# Patient Record
Sex: Male | Born: 1970 | Race: White | Hispanic: Yes | Marital: Single | Smoking: Never smoker
Health system: Southern US, Community
[De-identification: ages and names within clinical notes are randomized; demographics above are authoritative.]

---

## 2010-06-16 ENCOUNTER — Emergency Department (HOSPITAL_COMMUNITY): Admission: EM | Admit: 2010-06-16 | Discharge: 2010-06-16 | Payer: Self-pay | Admitting: Emergency Medicine
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
## 2020-06-18 ENCOUNTER — Encounter (HOSPITAL_COMMUNITY): Payer: Self-pay

## 2020-06-18 ENCOUNTER — Ambulatory Visit (HOSPITAL_COMMUNITY)
Admission: EM | Admit: 2020-06-18 | Discharge: 2020-06-18 | Disposition: A | Discharge status: Home / Self Care | Payer: Self-pay | Attending: Emergency Medicine | Admitting: Emergency Medicine

## 2020-06-18 ENCOUNTER — Other Ambulatory Visit: Payer: Self-pay

## 2020-06-18 DIAGNOSIS — L239 Allergic contact dermatitis, unspecified cause: Secondary | ICD-10-CM

## 2020-06-18 DIAGNOSIS — R21 Rash and other nonspecific skin eruption: Secondary | ICD-10-CM

## 2020-06-18 MED ORDER — CETIRIZINE HCL 10 MG PO CAPS: 10.0000 mg | ORAL_CAPSULE | Freq: Every day | ORAL | 0 refills | Status: DC

## 2020-06-18 MED ORDER — PREDNISONE 10 MG PO TABS: ORAL_TABLET | ORAL | 0 refills | Status: DC

## 2020-06-18 NOTE — ED Provider Notes (Signed)
[facility]    CSN: [id number] Arrival date & time: 06/18/20  1654      History   Chief Complaint Chief Complaint  Patient presents with  . Rash    HPI Joe Cook is a 49 y.o. male presenting today for evaluation of a rash.  Patient began to develop a rash to his left upper arm which has extended into his trunk and lower extremities over the past 3 days.  Associated with itching.  Denies any difficulty breathing or shortness of breath.  Denies any rash on face or mouth.  Denies any new exposures.  Denies changes in hygiene products.  Denies any new foods or medicines.  Has been in the backyard for the past week, but no known exposure to poison ivy.  Took some Benadryl for symptoms.  HPI  History reviewed. No pertinent past medical history.  There are no problems to display for this patient.   History reviewed. No pertinent surgical history.     Home Medications    Prior to Admission medications   Medication Sig Start Date End Date Taking? Authorizing Provider  Cetirizine HCl 10 MG CAPS Take 1 capsule (10 mg total) by mouth daily for 10 days. 06/18/20 06/28/20  Rexanne Inocencio C, PA-C  predniSONE (DELTASONE) 10 MG tablet Begin with 6 tabs on day 1, 5 tab on day 2, 4 tab on day 3, 3 tab on day 4, 2 tab on day 5, 1 tab on day 6-take with food 06/18/20   Zakirah Weingart, Junius Creamer, PA-C    Family History History reviewed. No pertinent family history.  Social History Social History   Tobacco Use  . Smoking status: Never Smoker  . Smokeless tobacco: Never Used  Substance Use Topics  . Alcohol use: Yes  . Drug use: Never     Allergies   Patient has no known allergies.   Review of Systems Review of Systems  Constitutional: Negative for fatigue and fever.  Eyes: Negative for redness, itching and visual disturbance.  Respiratory: Negative for shortness of breath.   Cardiovascular: Negative for chest pain and leg swelling.  Gastrointestinal: Negative for  nausea and vomiting.  Musculoskeletal: Negative for arthralgias and myalgias.  Skin: Positive for color change and rash. Negative for wound.  Neurological: Negative for dizziness, syncope, weakness, light-headedness and headaches.     Physical Exam Triage Vital Signs ED Triage Vitals  Enc Vitals Group     BP 06/18/20 1810 (!) 141/93     Pulse Rate 06/18/20 1810 67     Resp 06/18/20 1810 18     Temp 06/18/20 1810 98 F (36.7 C)     Temp Source 06/18/20 1810 Oral     SpO2 06/18/20 1810 96 %     Weight --      Height --      Head Circumference --      Peak Flow --      Pain Score 06/18/20 1808 0     Pain Loc --      Pain Edu? --      Excl. in GC? --    No data found.  Updated Vital Signs BP (!) 141/93 (BP Location: Right Arm)   Pulse 67   Temp 98 F (36.7 C) (Oral)   Resp 18   SpO2 96%   Visual Acuity Right Eye Distance:   Left Eye Distance:   Bilateral Distance:    Right Eye Near:   Left Eye Near:  Bilateral Near:     Physical Exam Vitals and nursing note reviewed.  Constitutional:      Appearance: He is well-developed.     Comments: No acute distress  HENT:     Head: Normocephalic and atraumatic.     Nose: Nose normal.     Mouth/Throat:     Comments: Oral mucosa pink and moist, no tonsillar enlargement or exudate. Posterior pharynx patent and nonerythematous, no uvula deviation or swelling. Normal phonation. Eyes:     Conjunctiva/sclera: Conjunctivae normal.  Cardiovascular:     Rate and Rhythm: Normal rate.  Pulmonary:     Effort: Pulmonary effort is normal. No respiratory distress.     Comments: Breathing comfortably at rest, CTABL, no wheezing, rales or other adventitious sounds auscultated Abdominal:     General: There is no distension.  Musculoskeletal:        General: Normal range of motion.     Cervical back: Neck supple.  Skin:    General: Skin is warm and dry.     Findings: Rash present.     Comments: Erythematous faintly papular rash  noted to upper extremity, abdomen as well as lower extremities  No lesions noted on face  Neurological:     Mental Status: He is alert and oriented to person, place, and time.      UC Treatments / Results  Labs (all labs ordered are listed, but only abnormal results are displayed) Labs Reviewed - No data to display  EKG   Radiology No results found.  Procedures Procedures (including critical care time)  Medications Ordered in UC Medications - No data to display  Initial Impression / Assessment and Plan / UC Course  I have reviewed the triage vital signs and the nursing notes.  Pertinent labs & imaging results that were available during my care of the patient were reviewed by me and considered in my medical decision making (see chart for details).     Rash was consistent with allergic versus contact dermatitis.  Recommending prednisone taper along with antihistamines.  Does not appear infectious at this time.  No systemic symptoms.  Discussed strict return precautions. Patient verbalized understanding and is agreeable with plan.  Final Clinical Impressions(s) / UC Diagnoses   Final diagnoses:  Rash and nonspecific skin eruption  Allergic contact dermatitis, unspecified trigger     Discharge Instructions     Begin prednisone taper over the next 6 days-begin with 6 tabs/60 mg, decrease by 1 tablet each day until complete-6, 5, 4, 3, 2, 1-take with food and in the morning if you are able Please also continue with antihistamines-daily cetirizine/Zyrtec in the morning, Benadryl/diphenhydramine at bedtime Please monitor for gradual resolution of rash over the next week Follow-up if symptoms not improving or worsening    ED Prescriptions    Medication Sig Dispense Auth. Provider   predniSONE (DELTASONE) 10 MG tablet Begin with 6 tabs on day 1, 5 tab on day 2, 4 tab on day 3, 3 tab on day 4, 2 tab on day 5, 1 tab on day 6-take with food 21 tablet Aubrei Bouchie C, PA-C    Cetirizine HCl 10 MG CAPS Take 1 capsule (10 mg total) by mouth daily for 10 days. 10 capsule Hermione Havlicek, [place] C, PA-C     PDMP not reviewed this encounter.   Lew Dawes, [place] 06/18/20 1855

## 2020-06-18 NOTE — Discharge Instructions (Signed)
Begin prednisone taper over the next 6 days-begin with 6 tabs/60 mg, decrease by 1 tablet each day until complete-6, 5, 4, 3, 2, 1-take with food and in the morning if you are able Please also continue with antihistamines-daily cetirizine/Zyrtec in the morning, Benadryl/diphenhydramine at bedtime Please monitor for gradual resolution of rash over the next week Follow-up if symptoms not improving or worsening

## 2020-06-18 NOTE — ED Triage Notes (Signed)
Pt presents with rash in arms, legs and torso x 3 days. Pt states he's being in the backyard for the past week.

## 2020-09-18 ENCOUNTER — Encounter: Payer: Self-pay | Admitting: Physician Assistant

## 2020-09-18 ENCOUNTER — Ambulatory Visit (INDEPENDENT_AMBULATORY_CARE_PROVIDER_SITE_OTHER): Payer: Self-pay

## 2020-09-18 ENCOUNTER — Ambulatory Visit (INDEPENDENT_AMBULATORY_CARE_PROVIDER_SITE_OTHER): Payer: Self-pay | Admitting: Physician Assistant

## 2020-09-18 DIAGNOSIS — M25532 Pain in left wrist: Secondary | ICD-10-CM

## 2020-09-18 DIAGNOSIS — M25511 Pain in right shoulder: Secondary | ICD-10-CM

## 2020-09-18 MED ORDER — METHYLPREDNISOLONE ACETATE 40 MG/ML IJ SUSP
40.0000 mg | INTRAMUSCULAR | Status: AC | PRN
  Administered 2020-09-18: 40 mg via INTRA_ARTICULAR

## 2020-09-18 MED ORDER — LIDOCAINE HCL 1 % IJ SOLN
3.0000 mL | INTRAMUSCULAR | Status: AC | PRN
  Administered 2020-09-18: 3 mL
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
## 2021-10-12 ENCOUNTER — Telehealth: Payer: Self-pay

## 2021-10-12 NOTE — Telephone Encounter (Signed)
Called patient no answer.   Needs appt with Gil/[facility]man since they've seen him in the past.    Call #1

## 2021-10-13 NOTE — Telephone Encounter (Signed)
LMOM

## 2021-10-13 NOTE — Telephone Encounter (Signed)
Called patient no answer Call#2 

## 2021-10-14 NOTE — Telephone Encounter (Signed)
Called patient no answer LMOM.  Call #3.

## 2021-11-05 ENCOUNTER — Encounter: Payer: Self-pay | Admitting: Physician Assistant

## 2021-11-05 ENCOUNTER — Other Ambulatory Visit: Payer: Self-pay

## 2021-11-05 ENCOUNTER — Ambulatory Visit (INDEPENDENT_AMBULATORY_CARE_PROVIDER_SITE_OTHER): Payer: 59 | Admitting: Physician Assistant

## 2021-11-05 ENCOUNTER — Ambulatory Visit (INDEPENDENT_AMBULATORY_CARE_PROVIDER_SITE_OTHER): Payer: 59

## 2021-11-05 DIAGNOSIS — M25511 Pain in right shoulder: Secondary | ICD-10-CM

## 2021-11-05 NOTE — Addendum Note (Signed)
Addended by: Barbette Or on: 11/05/2021 04:46 PM   Modules accepted: Orders

## 2021-11-05 NOTE — Progress Notes (Signed)
° °  Office Visit Note   Patient: Joe Cook           Date of Birth: 09/29/1971           MRN: [id number] Visit Date: 11/05/2021              Requested by: No referring provider defined for this encounter. PCP: Sherral Hammers, FNP   Assessment & Plan: Visit Diagnoses:  1. Right shoulder pain, unspecified chronicity     Plan:  Given the fact that he has had significant improvement with physical therapy while in [place] but did not gain a home exercise program recommend he return to therapy for home exercise program.  Therapy will include range of motion, strengthening and modalities to the right shoulder.  He will follow-up with [place] as needed.  Questions were encouraged and answered at length today.  Follow-Up Instructions: Return if symptoms worsen or fail to improve.   Orders:  Orders Placed This Encounter  Procedures   XR Shoulder Right   No orders of the defined types were placed in this encounter.     Procedures: No procedures performed   Clinical Data: No additional findings.   Subjective: Chief Complaint  Patient presents with   Right Shoulder - Pain    HPI Joe Cook is a 50 year old male who was seen last in November 2021 for right shoulder pain.  He was given an injection has helped some.  He is also been seen while in [place] injection was given there.  He did not find much benefit with the injection as he did going to therapy he had 3 visits while in [place] for the right shoulder and states overall he is doing much better just has some mild discomfort in the shoulder but his range of motion is definitely improved.  He has had no new injury to the right shoulder.  Review of Systems  Constitutional:  Negative for chills and fever.    Objective: Vital Signs: There were no vitals taken for this visit.  Physical Exam Constitutional:      Appearance: He is not ill-appearing or diaphoretic.  Pulmonary:     Effort: Pulmonary effort is normal.   Neurological:     Mental Status: He is alert and oriented to person, place, and time.  Psychiatric:        Mood and Affect: Mood normal.    Ortho Exam Bilateral shoulders 5 out of 5 strength with external and internal rotation against resistance.  Impingement testing is negative bilaterally.  Liftoff test is negative bilaterally.  Excellent range of motion bilateral shoulders actively. Specialty Comments:  No specialty comments available.  Imaging: XR Shoulder Right  Result Date: 11/05/2021 Right shoulder 3 views: Shoulders well located.  Glenohumeral joints well-maintained.  No acute fractures bony   abnormalities.     PMFS History: There are no problems to display for this patient.  History reviewed. No pertinent past medical history.  History reviewed. No pertinent family history.  History reviewed. No pertinent surgical history. Social History   Occupational History   Not on file  Tobacco Use   Smoking status: Never   Smokeless tobacco: Never  Substance and Sexual Activity   Alcohol use: Yes   Drug use: Never   Sexual activity: Not on file

## 2021-11-12 ENCOUNTER — Ambulatory Visit (INDEPENDENT_AMBULATORY_CARE_PROVIDER_SITE_OTHER): Payer: 59 | Admitting: Physical Therapy

## 2021-11-12 ENCOUNTER — Encounter: Payer: Self-pay | Admitting: Physical Therapy

## 2021-11-12 ENCOUNTER — Other Ambulatory Visit: Payer: Self-pay

## 2021-11-12 DIAGNOSIS — R293 Abnormal posture: Secondary | ICD-10-CM

## 2021-11-12 DIAGNOSIS — M6281 Muscle weakness (generalized): Secondary | ICD-10-CM | POA: Diagnosis not present

## 2021-11-12 DIAGNOSIS — M25511 Pain in right shoulder: Secondary | ICD-10-CM | POA: Diagnosis not present

## 2021-11-12 NOTE — Patient Instructions (Signed)
Access Code: [email or web address] URL: [email or web address] Date: 11/12/2021 Prepared by: Moshe Cipro  Exercises Single Arm Shoulder Flexion with Dumbbell - 1 x daily - 7 x weekly - 3 sets - 10 reps Standing Single Arm Shoulder Abduction with Dumbbell - Thumb Up - 1 x daily - 7 x weekly - 3 sets - 10 reps Shoulder Overhead Press in Abduction with Dumbbells - 1 x daily - 7 x weekly - 3 sets - 10 reps  Patient Education TENS Unit
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
